# Patient Record
Sex: Female | Born: 1995 | Race: White | Hispanic: No | Marital: Married | Smoking: Never smoker
Health system: Southern US, Community
[De-identification: ages and names within clinical notes are randomized; demographics above are authoritative.]

## PROBLEM LIST (undated history)

## (undated) DIAGNOSIS — R198 Other specified symptoms and signs involving the digestive system and abdomen: Secondary | ICD-10-CM

## (undated) DIAGNOSIS — Z8719 Personal history of other diseases of the digestive system: Secondary | ICD-10-CM

## (undated) DIAGNOSIS — N920 Excessive and frequent menstruation with regular cycle: Secondary | ICD-10-CM

## (undated) DIAGNOSIS — Q892 Congenital malformations of other endocrine glands: Secondary | ICD-10-CM

---
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
## 2024-11-29 ENCOUNTER — Inpatient Hospital Stay (HOSPITAL_COMMUNITY): Admission: AD | Admit: 2024-11-29 | Source: Home / Self Care | Admitting: Obstetrics and Gynecology
